# Patient Record
Sex: Female | Born: 1975 | Race: White | Hispanic: No | Marital: Married | Smoking: Never smoker
Health system: Southern US, Community
[De-identification: ages and names within clinical notes are randomized; demographics above are authoritative.]

## PROBLEM LIST (undated history)

## (undated) DIAGNOSIS — G540 Brachial plexus disorders: Secondary | ICD-10-CM

## (undated) DIAGNOSIS — D219 Benign neoplasm of connective and other soft tissue, unspecified: Secondary | ICD-10-CM

## (undated) DIAGNOSIS — H9190 Unspecified hearing loss, unspecified ear: Secondary | ICD-10-CM

## (undated) DIAGNOSIS — F988 Other specified behavioral and emotional disorders with onset usually occurring in childhood and adolescence: Secondary | ICD-10-CM

## (undated) DIAGNOSIS — A6000 Herpesviral infection of urogenital system, unspecified: Secondary | ICD-10-CM

## (undated) DIAGNOSIS — D649 Anemia, unspecified: Secondary | ICD-10-CM

## (undated) DIAGNOSIS — K5900 Constipation, unspecified: Secondary | ICD-10-CM

## (undated) DIAGNOSIS — N920 Excessive and frequent menstruation with regular cycle: Secondary | ICD-10-CM

## (undated) HISTORY — DX: Herpesviral infection of urogenital system, unspecified: A60.00

## (undated) HISTORY — DX: Excessive and frequent menstruation with regular cycle: N92.0

## (undated) HISTORY — PX: COLONOSCOPY: SHX174

## (undated) HISTORY — DX: Unspecified hearing loss, unspecified ear: H91.90

## (undated) HISTORY — PX: ABDOMINAL HYSTERECTOMY: SHX81

## (undated) HISTORY — DX: Anemia, unspecified: D64.9

## (undated) HISTORY — DX: Other specified behavioral and emotional disorders with onset usually occurring in childhood and adolescence: F98.8

## (undated) HISTORY — DX: Brachial plexus disorders: G54.0

## (undated) HISTORY — PX: DIAGNOSTIC LAPAROSCOPY WITH REMOVAL OF ECTOPIC PREGNANCY: SHX6449

## (undated) HISTORY — DX: Constipation, unspecified: K59.00

## (undated) HISTORY — DX: Benign neoplasm of connective and other soft tissue, unspecified: D21.9

---

## 1995-04-12 HISTORY — PX: AUGMENTATION MAMMAPLASTY: SUR837

## 2004-11-24 ENCOUNTER — Other Ambulatory Visit: Admission: RE | Admit: 2004-11-24 | Discharge: 2004-11-24 | Payer: Self-pay | Admitting: Obstetrics and Gynecology

## 2006-10-11 ENCOUNTER — Ambulatory Visit: Payer: Self-pay | Admitting: Internal Medicine

## 2006-11-13 ENCOUNTER — Ambulatory Visit: Payer: Self-pay | Admitting: Internal Medicine

## 2006-11-17 ENCOUNTER — Ambulatory Visit: Payer: Self-pay | Admitting: Internal Medicine

## 2006-11-17 LAB — CONVERTED CEMR LAB
AST: 20 units/L (ref 0–37)
Basophils Absolute: 0 10*3/uL (ref 0.0–0.1)
Bilirubin, Direct: 0.1 mg/dL (ref 0.0–0.3)
CO2: 29 meq/L (ref 19–32)
Chloride: 103 meq/L (ref 96–112)
Creatinine, Ser: 0.9 mg/dL (ref 0.4–1.2)
Direct LDL: 130.7 mg/dL
Eosinophils Relative: 0.9 % (ref 0.0–5.0)
Glucose, Bld: 98 mg/dL (ref 70–99)
HCT: 41.3 % (ref 36.0–46.0)
Hemoglobin: 14.6 g/dL (ref 12.0–15.0)
MCHC: 35.3 g/dL (ref 30.0–36.0)
Monocytes Absolute: 0.5 10*3/uL (ref 0.2–0.7)
Neutrophils Relative %: 73.1 % (ref 43.0–77.0)
Potassium: 4.3 meq/L (ref 3.5–5.1)
RDW: 11.7 % (ref 11.5–14.6)
Sodium: 140 meq/L (ref 135–145)
TSH: 2 microintl units/mL (ref 0.35–5.50)
Total Bilirubin: 0.7 mg/dL (ref 0.3–1.2)
Total CHOL/HDL Ratio: 2.9
Total Protein: 7.3 g/dL (ref 6.0–8.3)
WBC: 7.7 10*3/uL (ref 4.5–10.5)

## 2006-12-19 LAB — CONVERTED CEMR LAB: Pap Smear: NORMAL

## 2007-12-12 ENCOUNTER — Ambulatory Visit: Payer: Self-pay | Admitting: Internal Medicine

## 2007-12-12 DIAGNOSIS — F988 Other specified behavioral and emotional disorders with onset usually occurring in childhood and adolescence: Secondary | ICD-10-CM | POA: Insufficient documentation

## 2007-12-12 DIAGNOSIS — J309 Allergic rhinitis, unspecified: Secondary | ICD-10-CM | POA: Insufficient documentation

## 2007-12-26 ENCOUNTER — Ambulatory Visit: Payer: Self-pay | Admitting: *Deleted

## 2007-12-28 ENCOUNTER — Encounter: Payer: Self-pay | Admitting: Internal Medicine

## 2008-01-02 ENCOUNTER — Ambulatory Visit: Payer: Self-pay | Admitting: *Deleted

## 2008-01-09 ENCOUNTER — Ambulatory Visit: Payer: Self-pay | Admitting: *Deleted

## 2008-01-17 ENCOUNTER — Ambulatory Visit: Payer: Self-pay | Admitting: Internal Medicine

## 2008-01-24 ENCOUNTER — Telehealth: Payer: Self-pay | Admitting: Internal Medicine

## 2008-02-06 ENCOUNTER — Encounter: Payer: Self-pay | Admitting: Internal Medicine

## 2008-02-11 ENCOUNTER — Ambulatory Visit: Payer: Self-pay | Admitting: Internal Medicine

## 2008-03-04 ENCOUNTER — Ambulatory Visit: Payer: Self-pay | Admitting: Internal Medicine

## 2008-03-17 ENCOUNTER — Telehealth: Payer: Self-pay | Admitting: Internal Medicine

## 2008-04-15 ENCOUNTER — Telehealth (INDEPENDENT_AMBULATORY_CARE_PROVIDER_SITE_OTHER): Payer: Self-pay | Admitting: *Deleted

## 2008-04-16 ENCOUNTER — Ambulatory Visit: Payer: Self-pay | Admitting: Internal Medicine

## 2008-06-12 ENCOUNTER — Ambulatory Visit: Payer: Self-pay | Admitting: Internal Medicine

## 2008-08-18 ENCOUNTER — Telehealth: Payer: Self-pay | Admitting: Internal Medicine

## 2008-08-21 ENCOUNTER — Ambulatory Visit: Payer: Self-pay | Admitting: Internal Medicine

## 2008-11-13 ENCOUNTER — Ambulatory Visit: Payer: Self-pay | Admitting: Internal Medicine

## 2008-11-27 ENCOUNTER — Telehealth (INDEPENDENT_AMBULATORY_CARE_PROVIDER_SITE_OTHER): Payer: Self-pay | Admitting: *Deleted

## 2009-02-11 ENCOUNTER — Ambulatory Visit: Payer: Self-pay | Admitting: Internal Medicine

## 2009-05-13 ENCOUNTER — Ambulatory Visit: Payer: Self-pay | Admitting: Internal Medicine

## 2009-08-13 ENCOUNTER — Ambulatory Visit: Payer: Self-pay | Admitting: Internal Medicine

## 2009-10-27 ENCOUNTER — Telehealth (INDEPENDENT_AMBULATORY_CARE_PROVIDER_SITE_OTHER): Payer: Self-pay | Admitting: *Deleted

## 2010-05-11 NOTE — Assessment & Plan Note (Signed)
Summary: MEDICATION REFILL /HEA   Vital Signs:  Patient profile:   35 year old female Weight:      119.50 pounds BMI:     19.96 O2 Sat:      100 % on Room air Temp:     98.0 degrees F oral Pulse rate:   78 / minute Pulse rhythm:   regular Resp:     18 per minute BP sitting:   110 / 90  (right arm) Cuff size:   regular  Vitals Entered By: Glendell Docker CMA (May 13, 2009 9:21 AM)  O2 Flow:  Room air  Primary Care Provider:  D. Thomos Lemons DO  CC:  Medication Follow up .  History of Present Illness: Medication Follow up  35 year old white female with history of ADD for followup. no significant change since previous visit no significant weight change Denies sleep disturbance No behavioral issues  Still working in [employer] is picking up  hx of allergic rhinitis - wakes up with nasal congestion.  no sinus HA or pain  Allergies (verified): 1)  ! * Tiger Balm  Past History:  Past Medical History: ADD History of ectopic pregnancy x2 in 14 and in 1994. Chronic constipation status post negative colonoscopy in 1993.      Hearing loss     Family History: Colon CA - no Breast CA - no CAD - no Attention deficit disorder -sister      Social History: Occupation: Realtor/broker Married Never Smoked Alcohol use-yes            Physical Exam  General:  thin pleasant 35 y/o NAD Lungs:  normal respiratory effort and normal breath sounds.   Heart:  normal rate, regular rhythm, and no gallop.   Psych:  normally interactive, good eye contact, not anxious appearing, and not depressed appearing.     Impression & Recommendations:  Problem # 1:  ADD (ICD-314.00) Assessment Unchanged Stable.  effects of vyvanse not wearing off by afternoon.  pt tolerating vyvanse w/o side effects.  Maintain current medication regimen.  Problem # 2:  ALLERGIC RHINITIS (ICD-477.9) use nasal saline irrigation as directed.  Her updated medication list for this  problem includes:    Fluticasone Propionate 50 Mcg/act Susp (Fluticasone propionate) .Marland Kitchen... 2 sprays each nostril once daily  Complete Medication List: 1)  Loestrin Fe 1/20 1-20 Mg-mcg Tabs (Norethin ace-eth estrad-fe) .... Take 1 tablet by mouth once a day 2)  Vyvanse 70 Mg Caps (Lisdexamfetamine dimesylate) .... One by mouth once daily 3)  Vyvanse 70 Mg Caps (Lisdexamfetamine dimesylate) .... One by mouth once daily (fill on or after 06/10/2009) 4)  Vyvanse 70 Mg Caps (Lisdexamfetamine dimesylate) .... One by mouth once daily (fill on or after 07/11/2009) 5)  Fluticasone Propionate 50 Mcg/act Susp (Fluticasone propionate) .... 2 sprays each nostril once daily  Patient Instructions: 1)  Please schedule a follow-up appointment in 3 months. 2)  Use Lloyd Huger Med sinus rinse Prescriptions: VYVANSE 70 MG CAPS (LISDEXAMFETAMINE DIMESYLATE) one by mouth once daily (fill on or after 07/11/2009)  #30 x 0   Entered and Authorized by:   D. Thomos Lemons DO   Signed by:   D. Thomos Lemons DO on 05/13/2009   Method used:   Print then Give to Patient   RxID:   [id number] VYVANSE 70 MG CAPS (LISDEXAMFETAMINE DIMESYLATE) one by mouth once daily (fill on or after 06/10/2009)  #30 x 0   Entered and Authorized by:   D.  Thomos Lemons DO   Signed by:   D. Thomos Lemons DO on 05/13/2009   Method used:   Print then Give to Patient   RxID:   [id number] VYVANSE 70 MG CAPS (LISDEXAMFETAMINE DIMESYLATE) one by mouth once daily (fill on or after 06/10/2009)  #30 x 0   Entered and Authorized by:   D. Thomos Lemons DO   Signed by:   D. Thomos Lemons DO on 05/13/2009   Method used:   Print then Give to Patient   RxID:   [id number] VYVANSE 70 MG CAPS (LISDEXAMFETAMINE DIMESYLATE) one by mouth once daily  #30 x 0   Entered and Authorized by:   D. Thomos Lemons DO   Signed by:   D. Thomos Lemons DO on 05/13/2009   Method used:   Print then Give to Patient   RxID:   [id number] FLUTICASONE PROPIONATE 50 MCG/ACT SUSP (FLUTICASONE  PROPIONATE) 2 sprays each nostril once daily  #1 x 3   Entered and Authorized by:   D. Thomos Lemons DO   Signed by:   D. Thomos Lemons DO on 05/13/2009   Method used:   Electronically to        [id number] (retail)       [address]       [place], [place]  [address]       Ph: [phone] or [phone]       Fax: [phone]   RxID:   [id number]     Current Allergies (reviewed today): ! * TIGER BALM

## 2010-05-11 NOTE — Progress Notes (Signed)
  Phone Note Other Incoming   Request: Send information Summary of Call: Request for records received from  [facility]. Request forwarded to [facility].

## 2010-05-11 NOTE — Assessment & Plan Note (Signed)
Summary: weight check and refill meds/mhf   Vital Signs:  Patient profile:   35 year old female Height:      65 inches Weight:      116.50 pounds BMI:     19.46 O2 Sat:      100 % on Room air Temp:     99.0 degrees F oral Pulse rate:   77 / minute Pulse rhythm:   regular Resp:     16 per minute BP sitting:   130 / 80  (right arm) Cuff size:   regular  Vitals Entered By: Glendell Docker CMA (Aug 13, 2009 1:58 PM)  O2 Flow:  Room air CC: Rm 2- 3 Month follow up    Primary Care Provider:  Dondra Spry DO  CC:  Rm 2- 3 Month follow up .  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 * Tiger Balm  Past History:  Past Medical History: ADD History of ectopic pregnancy x 2 in 1993 and in 1994. Chronic constipation status post negative colonoscopy in 1993.      Hearing loss       Past Surgical History: Status post breast augmentation 1997. Bilateral hearing aids 2010         Family History: Colon CA - no Breast CA - no CAD - no Attention deficit disorder -sister       Social History: Occupation: Realtor/broker Married Never Smoked Alcohol use-yes         Physical Exam  General:  alert, well-developed, and well-nourished.   Lungs:  normal respiratory effort and normal breath sounds.   Heart:  normal rate, regular rhythm, and no gallop.     Impression & Recommendations:  Problem # 1:  ADD (ICD-314.00) Assessment Unchanged Pt with wt loss.  she attributes to regular exercise.  we discussed increasing intake of high calorie foods. if persistent wt loss, we discussed lowering vyvanse dose  Complete Medication List: 1)  Vyvanse 70 Mg Caps (Lisdexamfetamine dimesylate) .... One by mouth once daily 2)  Vyvanse 70 Mg Caps (Lisdexamfetamine  dimesylate) .... One by mouth once daily (fill on or after 09/13/2009) 3)  Vyvanse 70 Mg Caps (Lisdexamfetamine dimesylate) .... One by mouth once daily (fill on or after 10/13/2009) 4)  Fluticasone Propionate 50 Mcg/act Susp (Fluticasone propionate) .... 2 sprays each nostril once daily  Patient Instructions: 1)  Please schedule a follow-up appointment in 3 months. Prescriptions: VYVANSE 70 MG CAPS (LISDEXAMFETAMINE DIMESYLATE) one by mouth once daily (fill on or after 10/13/2009)  #30 x 0   Entered and Authorized by:   D. Thomos Lemons DO   Signed by:   D. Thomos Lemons DO on 08/13/2009   Method used:   Print then Give to Patient   RxID:   [id number] VYVANSE 70 MG CAPS (LISDEXAMFETAMINE DIMESYLATE) one by mouth once daily (fill on or after 09/13/2009)  #30 x 0   Entered and Authorized by:   D. Thomos Lemons DO   Signed by:   D. Thomos Lemons DO on 08/13/2009   Method used:   Print then Give to Patient   RxID:   [id number] VYVANSE 70 MG CAPS (LISDEXAMFETAMINE DIMESYLATE) one by mouth once daily  #30 x 0   Entered  and Authorized by:   D. Thomos Lemons DO   Signed by:   D. Thomos Lemons DO on 08/13/2009   Method used:   Print then Give to Patient   RxID:   [id number]   Current Allergies (reviewed today): ! * TIGER BALM

## 2010-08-24 NOTE — Assessment & Plan Note (Signed)
[facility]                           PRIMARY CARE OFFICE NOTE   Chelsea Weeks, Chelsea Weeks                      MRN:          [id number]  DATE:10/11/2006                            DOB:          08/09/1975    HISTORY AND PHYSICAL   CHIEF COMPLAINT:  A new patient to practice/ADHD.   HISTORY OF PRESENT ILLNESS:  The patient is a 35 year old white female  here to establish primary care.  She has been seeing her gynecologist  for most of her primary care needs.  She has been fairly healthy for  most of her life with the exception of ectopic pregnancy in 1993 and  1994.  They were able to perform surgery to preserve her fallopian tubes  and ovaries.  She has been working with her gynecologist regarding  issues with possible depression and possible ADD.  She reports symptoms  of ADD starting during her high school years wit difficulty  concentrating and completing assignments on time.  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  She was  prescribed Effexor XR 75 mg by her gynecologist.  She has been on  Effexor for approximately 1 year.  She has not noticed any significant  improvement in her ability to concentrate and focus.   PAST MEDICAL HISTORY/SUMMARY:  1. History of ectopic pregnancy x2 in 1993 and in 1994.  2. Status post breast augmentation 1997.  3. Chronic constipation status post negative colonoscopy in 1993.   CURRENT MEDICATIONS:  1. Loestrin once a day.  2. Effexor XR 75 mg once a day.   ALLERGIES TO MEDICATIONS:  None known.   SOCIAL HISTORY:  Patient is married, works as a Medical laboratory scientific officer, does not  have any children.  Does not plan on having any children.   HABITS:  Occasional alcohol, no tobacco and no recreational drugs.   FAMILY HISTORY:  Parents are healthy.  No significant medical issues.  Denies family history of premature heart disease or colon cancer.   REVIEW OF SYSTEMS:  No HEENT  symptoms.  Denies any chest pain,  palpitations, chronic cough or shortness of breath.  Denies dyspnea on  exertion.  No heartburn, nausea or vomiting.  Positive constipation with  bowel movements q. 1-2 weeks.  No dysuria, frequency, urgency and all  other systems negative.   PHYSICAL EXAMINATION:  VITAL SIGNS:  Height is 5 feet 4 inches. Weight  is 126.8 pounds.  Temperature is 98.8, pulse is 78.  BP is 120/70,  leftarm, in a seated position.  GENERAL:  Patient is a well-developed, well-nourished 35 year old white  female, no apparent distress.  HEENT:  Normocephalic, atraumatic.  Pupils were equal and reactive to  light bilaterally.  Extraocular motility was intact.  Patient was  anicteric.  Conjunctivae were within normal limits.  External auditory  canals and tympanic membranes were clear bilaterally.  There was some  mild gross hearing loss.  NECK:  Supple, no adenopathy, carotid bruit or thyromegaly.  Oropharyngeal exam looks unremarkable.  CHEST:  Normal expiratory effort. Chest is clear to auscultation  bilaterally and no rhonchi,  rales or wheezing.  CARDIOVASCULAR:  Regular rate and rhythm.  No significant murmurs, rubs  or gallops appreciated.  ABDOMEN:  Soft, nontender.  Positive bowel sounds.  No organomegaly.  MUSCULOSKELETAL:  No clubbing, cyanosis or edema.  SKIN:  Warm and dry.  NEUROLOGIC:  Cranial nerves II through XIIwere grossly intact.  She was  nonfocal.   IMPRESSIONS:  1. Attention deficit disorder.  2. History of chronic constipation.  3. History of ectopic pregnancy x2.  4. Health maintenance.   RECOMMENDATIONS:  1. Patient will taper off Effexor.  She has been given a prescription      for 37.5 mg to be taken once a day.  At the same time she will      start Wellbutrin XL 150 mg once a day to be upwardly titrated to      300 mg in 2 weeks.  2. We discussed the potential side effects.  3. We will check screening labs including thyroid function and  arrange      followup visit in 4 to 6 weeks.     Barbette Hair. Artist Pais, DO  Electronically Signed    RDY/MedQ  DD: 10/12/2006  DT: 10/12/2006  Job #: [id number]

## 2011-11-11 ENCOUNTER — Ambulatory Visit: Payer: Self-pay | Admitting: Internal Medicine

## 2012-04-11 HISTORY — PX: SALPINGECTOMY: SHX328

## 2012-07-11 ENCOUNTER — Ambulatory Visit: Payer: Self-pay

## 2012-09-06 ENCOUNTER — Ambulatory Visit: Payer: Self-pay | Admitting: Obstetrics and Gynecology

## 2012-09-06 LAB — BASIC METABOLIC PANEL
BUN: 19 mg/dL — ABNORMAL HIGH (ref 7–18)
EGFR (Non-African Amer.): >60
Osmolality: 279 (ref 275–301)
Potassium: 4 mmol/L (ref 3.5–5.1)

## 2012-09-06 LAB — CBC
HGB: 13.5 g/dL (ref 12.0–16.0)
MCH: 32.8 pg (ref 26.0–34.0)
MCHC: 35.2 g/dL (ref 32.0–36.0)
MCV: 93 fL (ref 80–100)
RBC: 4.13 10*6/uL (ref 3.80–5.20)
WBC: 5.9 10*3/uL (ref 3.6–11.0)

## 2012-09-06 LAB — PREGNANCY, URINE: Pregnancy Test, Urine: NEGATIVE m[IU]/mL

## 2012-09-14 ENCOUNTER — Ambulatory Visit: Payer: Self-pay | Admitting: Obstetrics and Gynecology

## 2012-09-14 HISTORY — PX: LAPAROSCOPIC SUPRACERVICAL HYSTERECTOMY: SUR797

## 2012-09-15 LAB — BASIC METABOLIC PANEL
Anion Gap: 4 — ABNORMAL LOW (ref 7–16)
BUN: 6 mg/dL — ABNORMAL LOW (ref 7–18)
Chloride: 107 mmol/L (ref 98–107)
Co2: 29 mmol/L (ref 21–32)
Creatinine: 0.65 mg/dL (ref 0.60–1.30)
EGFR (African American): >60
Glucose: 101 mg/dL — ABNORMAL HIGH (ref 65–99)
Osmolality: 277 (ref 275–301)
Potassium: 4 mmol/L (ref 3.5–5.1)

## 2012-09-15 LAB — HEMATOCRIT: HCT: 35.5 % (ref 35.0–47.0)

## 2012-09-18 LAB — PATHOLOGY REPORT

## 2014-08-01 NOTE — Op Note (Signed)
PATIENT NAME:  Chelsea Weeks, Chelsea Weeks MR#:  [id number] DATE OF BIRTH:  Oct 16, 1975  DATE OF PROCEDURE:  PREOPERATIVE DIAGNOSES:  Fibroid, dysmenorrhea, menorrhagia with anemia.   POSTOPERATIVE DIAGNOSES:  Fibroid, dysmenorrhea, menorrhagia with anemia.  PROCEDURE:  Laparoscopic supracervical hysterectomy, bilateral salpingectomy and lysis of severe bowel adhesions to the tube and left sidewall.   SURGEON:  Delsa Sale, MD  ASSISTANT:  Dr. Hiram Gash   ESTIMATED BLOOD LOSS:  100 mL.  FINDINGS:  Approximately 12-week size uterus, with multiple fibroids. Normal right tube and ovary. Left tube intertwined and adherent to the left bowel, which was adhered to the sidewall. Normal ureters bilaterally.   DESCRIPTION OF PROCEDURE:  The patient was taken to the Operating Room and placed in the supine position. After adequate general endotracheal anesthesia was instilled, the patient was prepped and draped in the usual sterile fashion. Timeout was performed. A side-opening speculum was placed in the patient's vagina. The anterior lip of the cervix was grasped with a single-tooth tenaculum, and the Hulka tenaculum was placed into the uterus. The side-opening speculum was removed. Catheter had been placed preoperatively. Attention was then turned to the umbilicus, which was injected with Marcaine. The umbilicus was incised. An infraumbilical incision was made. Veress needle was placed. Hang drop test, fluid instillation test, fluid aspiration test showed proper placement of the Veress needle. An 7 mm trocar was placed into the incision under direct visualization. The patient was placed in Trendelenburg. The aforementioned findings were seen. A 5 mm trocar port was placed in the right lower quadrant, an 11 mm trocar port was placed in the left lower quadrant. Photographs were taken. The Harmonic scalpel was placed into the belly, and the adhesions were sharply and bluntly dissected from the bowel and the tube. The  tube was then excised first on the left along the broad ligament. The round ligament was grasped and ligated. The uterine artery was skeletonized. The uterine artery was then grasped and ligated. A bladder flap was created. Attention was then turned to the right side, where the tube was excised from the broad ligament. The round ligament was grasped and ligated with the Harmonic scalpel. Attention was then turned to the uterine artery, which was skeletonized. A bladder flap was created. The bladder was pushed off the lower uterine segment. Harmonic scalpel was used to cut across the cervical uterine isthmus. The cervix was detached.the hukla tenaculum was removed. Trocar was removed from the left lower quadrant. Morcellator was placed. The uterus was morcellated under direct visualization in 2 pieces. The pelvis was copiously irrigated with warm normal saline. No fragments of tissue were identified. The bowel was then reinspected, given the additional amount of adhesions and manipulation of the bowel to remove it from the tube. No stretching or holes were identified. intercede  was then placed across the patient's cervix. The patient was taken out of Trendelenburg. the cone was then used to do a deep fascial stitch. Then 4-0 Monocryl was used to close the skin edges. Umbilical incision was approximated with a UR-6, 4-0 Monocryl on the skin edges, then with Dermabond. Clear urine was noted in the Foley bag. All the Marcaine had been used on the tubes prior to surgery. The patient was then taken to recovery after having tolerated the procedure well.     ____________________________ Delsa Sale, MD cck:mr D: 09/16/2012 14:21:24 ET T: 09/16/2012 22:23:27 ET JOB#: [id number]  cc: Delsa Sale, MD, <Dictator> Delsa Sale MD ELECTRONICALLY SIGNED  09/18/2012 11:58 

## 2016-04-11 DIAGNOSIS — G540 Brachial plexus disorders: Secondary | ICD-10-CM

## 2016-04-11 HISTORY — DX: Brachial plexus disorders: G54.0

## 2016-06-21 ENCOUNTER — Other Ambulatory Visit: Payer: Self-pay | Admitting: Family Medicine

## 2016-06-21 DIAGNOSIS — Z1231 Encounter for screening mammogram for malignant neoplasm of breast: Secondary | ICD-10-CM

## 2016-06-22 ENCOUNTER — Ambulatory Visit
Admission: RE | Admit: 2016-06-22 | Discharge: 2016-06-22 | Disposition: A | Discharge status: Home / Self Care | Payer: 59 | Source: Ambulatory Visit | Attending: Medical Oncology | Admitting: Medical Oncology

## 2016-06-22 ENCOUNTER — Other Ambulatory Visit: Payer: Self-pay | Admitting: Medical Oncology

## 2016-06-22 ENCOUNTER — Ambulatory Visit: Admission: RE | Admit: 2016-06-22 | Payer: 59 | Source: Ambulatory Visit

## 2016-06-22 DIAGNOSIS — R600 Localized edema: Secondary | ICD-10-CM

## 2016-06-22 DIAGNOSIS — R238 Other skin changes: Secondary | ICD-10-CM | POA: Insufficient documentation

## 2016-06-30 ENCOUNTER — Other Ambulatory Visit: Payer: Self-pay | Admitting: Family Medicine

## 2016-06-30 ENCOUNTER — Ambulatory Visit
Admission: RE | Admit: 2016-06-30 | Discharge: 2016-06-30 | Disposition: A | Discharge status: Home / Self Care | Payer: 59 | Source: Ambulatory Visit | Attending: Family Medicine | Admitting: Family Medicine

## 2016-06-30 DIAGNOSIS — Z1231 Encounter for screening mammogram for malignant neoplasm of breast: Secondary | ICD-10-CM | POA: Insufficient documentation

## 2016-07-18 ENCOUNTER — Ambulatory Visit (INDEPENDENT_AMBULATORY_CARE_PROVIDER_SITE_OTHER): Payer: 59 | Admitting: Vascular Surgery

## 2016-07-18 ENCOUNTER — Other Ambulatory Visit (INDEPENDENT_AMBULATORY_CARE_PROVIDER_SITE_OTHER): Payer: Self-pay | Admitting: Vascular Surgery

## 2016-07-18 ENCOUNTER — Encounter (INDEPENDENT_AMBULATORY_CARE_PROVIDER_SITE_OTHER): Payer: Self-pay | Admitting: Vascular Surgery

## 2016-07-18 ENCOUNTER — Encounter (INDEPENDENT_AMBULATORY_CARE_PROVIDER_SITE_OTHER): Payer: Self-pay

## 2016-07-18 VITALS — BP 159/103 | HR 74 | Resp 16 | Ht 64.0 in | Wt 118.0 lb

## 2016-07-18 DIAGNOSIS — I7300 Raynaud's syndrome without gangrene: Secondary | ICD-10-CM

## 2016-07-18 DIAGNOSIS — M79603 Pain in arm, unspecified: Secondary | ICD-10-CM | POA: Diagnosis not present

## 2016-07-18 DIAGNOSIS — F988 Other specified behavioral and emotional disorders with onset usually occurring in childhood and adolescence: Secondary | ICD-10-CM

## 2016-07-18 NOTE — Progress Notes (Signed)
MRN : [id number]  Chelsea Weeks is a 41 y.o. (Oct 09, 1975) female who presents with chief complaint of  Chief Complaint  Patient presents with  . New Evaluation    Arm Pain  .  History of Present Illness: The patient is seen for evaluation of painful right upper extremity.  Initially, the patient was seen by her primary care physician approximately one month ago for edema of the right arm which was associated with a discomfort that the patient described as a heaviness and tightness.  This was acute in onset occurring approximately 3 days prior to this initial evaluation. At the time she rated her discomfort as a 4 out of 10. This did not appear to be associated with any particular activities. She denied any alleviating or aggravating factors. She was questioned as to whether there were episodes of color changes or discoloration of the fingers in the past. She denied this. She is a nonsmoker there is no history of DVT in the past. No family history of bleeding clotting disorders. She is currently not taking hormone replacement.  The patient denies any trauma to the right upper extremity.  The patient denies rest pain or dangling of an extremity in a dependent position for relief. No open wounds or sores at this time. No history of DVT or phlebitis. No priorvascular interventions or surgeries.  There is a  history of back problems and DJD of the lumbar and sacral spine.    Current Meds  Medication Sig  . aspirin 81 MG chewable tablet Chew 81 mg by mouth daily.   Marland Kitchen lisdexamfetamine (VYVANSE) 70 MG capsule Take 70 mg by mouth daily.   . predniSONE (DELTASONE) 10 MG tablet Take 4 tabs on days 1-2, then 3 tabs on days 3-4, then 2 tabs on days 5-6 then 1 tab on day 7 then off  . vitamin B-12 (CYANOCOBALAMIN) 1000 MCG tablet Take 1,000 mcg by mouth daily.   . [DISCONTINUED] Ascorbic Acid (VITAMIN C) 1000 MG tablet Take by mouth.  . [DISCONTINUED] Cholecalciferol (VITAMIN D-1000 MAX ST)  1000 units tablet Take by mouth.  . [DISCONTINUED] cyclobenzaprine (FLEXERIL) 5 MG tablet Take by mouth.  . [DISCONTINUED] diclofenac (VOLTAREN) 75 MG EC tablet Take by mouth.  . [DISCONTINUED] fluticasone (FLONASE) 50 MCG/ACT nasal spray Place into the nose.  . [DISCONTINUED] MAGNESIUM PO Take by mouth.  . [DISCONTINUED] methocarbamol (ROBAXIN) 500 MG tablet   . [DISCONTINUED] valACYclovir (VALTREX) 500 MG tablet TAKE 4 TABLETS EVERY 12 HOURS FOR 2 DOSES, THEN TAKE 1 TABLET TWICE A DAY FOR 3-5 DAYS AS NEEDED    No past medical history on file.  Past Surgical History:  Procedure Laterality Date  . ABDOMINAL HYSTERECTOMY    . AUGMENTATION MAMMAPLASTY Bilateral 1997    Social History Social History  Substance Use Topics  . Smoking status: Never Smoker  . Smokeless tobacco: Never Used  . Alcohol use No    Family History Family History  Problem Relation Age of Onset  . Breast cancer Neg Hx   No family history of bleeding/clotting disorders, porphyria or autoimmune disease   Allergies  Allergen Reactions  . Fluzone [Flu Virus Vaccine] Anaphylaxis  . Prednisone     Blurred vision, facial numbness, hypertension, confusion   . Camphor-Menthol Rash    Tiger balm     REVIEW OF SYSTEMS (Negative unless checked)  Constitutional: [] Weight loss  [] Fever  [] Chills Cardiac: [] Chest pain   [] Chest pressure   [] Palpitations   [] Shortness  of breath when laying flat   [] Shortness of breath with exertion. Vascular:  [] Pain in legs with walking   [] Pain in legs at rest  [] History of DVT   [] Phlebitis   [] Swelling in legs   [] Varicose veins   [] Non-healing ulcers Pulmonary:   [] Uses home oxygen   [] Productive cough   [] Hemoptysis   [] Wheeze  [] COPD   [] Asthma Neurologic:  [] Dizziness   [] Seizures   [] History of stroke   [] History of TIA  [] Aphasia   [] Vissual changes   [] Weakness or numbness in arm   [] Weakness or numbness in leg Musculoskeletal:   [] Joint swelling   [] Joint pain   [] Low  back pain Hematologic:  [] Easy bruising  [] Easy bleeding   [] Hypercoagulable state   [] Anemic Gastrointestinal:  [] Diarrhea   [] Vomiting  [] Gastroesophageal reflux/heartburn   [] Difficulty swallowing. Genitourinary:  [] Chronic kidney disease   [] Difficult urination  [] Frequent urination   [] Blood in urine Skin:  [] Rashes   [] Ulcers  Psychological:  [] History of anxiety   []  History of major depression.  Physical Examination  Vitals:   07/18/16 0911  BP: (!) 159/103  Pulse: 74  Resp: 16  Weight: 53.5 kg (118 lb)  Height: 5\' 4"  (1.626 m)   Body mass index is 20.25 kg/m. Gen: WD/WN, NAD Head: [place]/AT, No temporalis wasting.  Ear/Nose/Throat: Hearing grossly intact, nares w/o erythema or drainage, poor dentition Eyes: PER, EOMI, sclera nonicteric.  Neck: Supple, no masses.  No bruit or JVD.  Pulmonary:  Good air movement, clear to auscultation bilaterally, no use of accessory muscles.  Cardiac: RRR, normal S1, S2, no Murmurs. Vascular: both hands demonstrate mild-to-moderate cyanotic changes with the right hand being more pronounced. The right arm is clearly larger than the left. It is nontender to palpation. The radial artery pulse does not diminish with military maneuvers. There are no palpable masses in the clavicular area. There are no bruits auscultated in the carotid or subclavian area. There is no point tenderness elicited Vessel Right Left  Radial Palpable Palpable  Ulnar Palpable Palpable  Brachial Palpable Palpable  Carotid Palpable Palpable  Gastrointestinal: soft, non-distended. No guarding/no peritoneal signs.  Musculoskeletal: M/S 5/5 throughout.  No deformity or atrophy.  Neurologic: CN 2-12 intact. Pain and light touch intact in extremities.  Symmetrical.  Speech is fluent. Motor exam as listed above. Psychiatric: Judgment intact, Mood & affect appropriate for pt's clinical situation. Dermatologic: No rashes or ulcers noted.  No changes consistent with cellulitis. Lymph  : No Cervical lymphadenopathy, no lichenification or skin changes of chronic lymphedema.  CBC Lab Results  Component Value Date   WBC 5.9 09/06/2012   HGB 13.5 09/06/2012   HCT 35.5 09/15/2012   MCV 93 09/06/2012   PLT 268 09/06/2012    BMET    Component Value Date/Time   NA 140 09/15/2012 0552   K 4.0 09/15/2012 0552   CL 107 09/15/2012 0552   CO2 29 09/15/2012 0552   GLUCOSE 101 (H) 09/15/2012 0552   BUN 6 (L) 09/15/2012 0552   CREATININE 0.65 09/15/2012 0552   CALCIUM 7.8 (L) 09/15/2012 0552   GFRNONAA >60 09/15/2012 0552   GFRAA >60 09/15/2012 0552   CrCl cannot be calculated (Patient's most recent lab result is older than the maximum 21 days allowed.).  COAG No results found for: INR, PROTIME  Radiology US Venous Img Upper Uni Right  Result Date: 06/22/2016 CLINICAL DATA:  Right arm edema and redness for 3 days EXAM: Right UPPER EXTREMITY VENOUS  DOPPLER ULTRASOUND TECHNIQUE: Gray-scale sonography with graded compression, as well as color Doppler and duplex ultrasound were performed to evaluate the upper extremity deep venous system from the level of the subclavian vein and including the jugular, axillary, basilic, radial, ulnar and upper cephalic vein. Spectral Doppler was utilized to evaluate flow at rest and with distal augmentation maneuvers. COMPARISON:  None. FINDINGS: Contralateral Subclavian Vein: Respiratory phasicity is normal and symmetric with the symptomatic side. No evidence of thrombus. Normal compressibility. Internal Jugular Vein: No evidence of thrombus. Normal compressibility, respiratory phasicity and response to augmentation. Subclavian Vein: No evidence of thrombus. Normal compressibility, respiratory phasicity and response to augmentation. Axillary Vein: No evidence of thrombus. Normal compressibility, respiratory phasicity and response to augmentation. Cephalic Vein: No evidence of thrombus. Normal compressibility, respiratory phasicity and response to  augmentation. Basilic Vein: No evidence of thrombus. Normal compressibility, respiratory phasicity and response to augmentation. Brachial Veins: No evidence of thrombus. Normal compressibility, respiratory phasicity and response to augmentation. Radial Veins: No evidence of thrombus. Normal compressibility, respiratory phasicity and response to augmentation. Ulnar Veins: No evidence of thrombus. Normal compressibility, respiratory phasicity and response to augmentation. IMPRESSION: No evidence of deep venous thrombosis. Electronically Signed   By: Donavan Foil M.D.   On: 06/22/2016 16:42   Mm Screening Breast W/implant Tomo Bilateral  Result Date: 06/30/2016 CLINICAL DATA:  Screening. EXAM: 2D DIGITAL SCREENING BILATERAL MAMMOGRAM WITH IMPLANTS, CAD AND ADJUNCT TOMO The patient has bilateral silicone implants. Standard and implant displaced views were performed. COMPARISON:  Previous exam(s). ACR Breast Density Category c: The breast tissue is heterogeneously dense, which may obscure small masses. FINDINGS: There are no findings suspicious for malignancy. Images were processed with CAD. IMPRESSION: No mammographic evidence of malignancy. A result letter of this screening mammogram will be mailed directly to the patient. RECOMMENDATION: Screening mammogram in one year. (Code:SM-B-01Y) BI-RADS CATEGORY  1:  Negative. Electronically Signed   By: Pamelia Hoit M.D.   On: 06/30/2016 13:14    Outside Studies/Documentation 10 pages of outside documents were reviewed.  They showed CT angiography of the left upper extremity dated 07/14/2016 demonstrates a widely patent arterial system without congenital abnormalities or pathologic changes then applied. There are no mass lesions in the apex of the thoracic cavity or the base of the neck. Mild edema of the forearm is confirmed.    X-ray of the C-spine dated 07/11/2016 is negative for a first rib there is diffuse degenerative changes identified.  CRP dated 07/14/2016  is negative also a sed rate same date is negative  Assessment/Plan 1. Pain of upper extremity, unspecified laterality  Recommend:  The patient has atypical pain symptoms of uncertain etiology. However, by history andon physical exam there is evidence of possible thoracic outlet syndrome or perhaps this could be complex pain syndrome.  The CT scan has excluded any mass lesions or tumors. There does not appear to be any hemodynamically significant arterial lesions. Her pulse by examination does not diminish with military maneuvers. And even though her duplex ultrasound was negative for DVT ultrasound is not able to adequately visualize the central venous system.  I do believe that there is a component of Raynaud's syndrome given the fact that her left hand also appeared to have mild to moderate cyanotic changes when I examined her. With a little bit of discussion it would appear that her trigger is more anxiety related and not necessarily cold related. However pager modification and the basic treatment plan for Raynaud's was reviewed with her.  Nevertheless I do not believe this explains everything since a pure Raynaud's process should not cause swelling of her arm as is clearly documented both on physical examination and by CT scan.  The patient should continue her usual exercise program.  I have recommended that we perform venogram to exclude a central venous stenosis and thoracic outlet syndrome with venous manifestations.The risks and benefits as well as the indications were reviewed all questions have been answered and she has agreed to proceed.  If the central venous system is normal and given the findings on CT angiography for her arterial system which appears to be normal I would then favor neurologic workup for complex pain syndrome.   A total of 70 minutes was spent with this patient and greater than 50% was spent in counseling and coordination of care with the patient.  Discussion included  the treatment options for vascular disease including indications for surgery and intervention.  Also discussed is the appropriate timing of treatment.  In addition medical therapy was discussed.  2. Raynaud's disease without gangrene See #1  3. Attention deficit disorder, unspecified hyperactivity presence Continue Vyvanse as prescribed    Hortencia Pilar, MD  07/18/2016 4:17 PM

## 2016-07-19 ENCOUNTER — Encounter
Admission: RE | Disposition: A | Discharge status: Home / Self Care | Payer: Self-pay | Source: Ambulatory Visit | Attending: Vascular Surgery

## 2016-07-19 ENCOUNTER — Encounter: Payer: Self-pay | Admitting: *Deleted

## 2016-07-19 ENCOUNTER — Ambulatory Visit
Admission: RE | Admit: 2016-07-19 | Discharge: 2016-07-19 | Disposition: A | Discharge status: Home / Self Care | Payer: Commercial Managed Care - HMO | Source: Ambulatory Visit | Attending: Vascular Surgery | Admitting: Vascular Surgery

## 2016-07-19 DIAGNOSIS — R2231 Localized swelling, mass and lump, right upper limb: Secondary | ICD-10-CM | POA: Diagnosis not present

## 2016-07-19 DIAGNOSIS — M79601 Pain in right arm: Secondary | ICD-10-CM | POA: Diagnosis not present

## 2016-07-19 DIAGNOSIS — Z90710 Acquired absence of both cervix and uterus: Secondary | ICD-10-CM | POA: Diagnosis not present

## 2016-07-19 DIAGNOSIS — I871 Compression of vein: Secondary | ICD-10-CM | POA: Diagnosis not present

## 2016-07-19 DIAGNOSIS — I7300 Raynaud's syndrome without gangrene: Secondary | ICD-10-CM | POA: Insufficient documentation

## 2016-07-19 DIAGNOSIS — F988 Other specified behavioral and emotional disorders with onset usually occurring in childhood and adolescence: Secondary | ICD-10-CM | POA: Insufficient documentation

## 2016-07-19 DIAGNOSIS — Z888 Allergy status to other drugs, medicaments and biological substances status: Secondary | ICD-10-CM | POA: Diagnosis not present

## 2016-07-19 DIAGNOSIS — Z887 Allergy status to serum and vaccine status: Secondary | ICD-10-CM | POA: Diagnosis not present

## 2016-07-19 HISTORY — PX: UPPER EXTREMITY VENOGRAPHY: CATH118272

## 2016-07-19 LAB — BUN: BUN: 12 mg/dL (ref 6–20)

## 2016-07-19 LAB — CREATININE, SERUM
Creatinine, Ser: 0.73 mg/dL (ref 0.44–1.00)
GFR calc Af Amer: >60 mL/min (ref >60)
GFR calc non Af Amer: >60 mL/min (ref >60)

## 2016-07-19 SURGERY — UPPER EXTREMITY VENOGRAPHY
Anesthesia: Moderate Sedation | Laterality: Right

## 2016-07-19 MED ORDER — FENTANYL CITRATE (PF) 100 MCG/2ML IJ SOLN
INTRAMUSCULAR | Status: DC | PRN
  Administered 2016-07-19 (×2): 50 ug via INTRAVENOUS

## 2016-07-19 MED ORDER — ONDANSETRON HCL 4 MG/2ML IJ SOLN: 4.0000 mg | Freq: Four times a day (QID) | INTRAMUSCULAR | Status: DC | PRN

## 2016-07-19 MED ORDER — MIDAZOLAM HCL 2 MG/2ML IJ SOLN
INTRAMUSCULAR | Status: DC | PRN
Start: 2016-07-19 — End: 2016-07-19
  Administered 2016-07-19: 2 mg via INTRAVENOUS
  Administered 2016-07-19: 1 mg via INTRAVENOUS

## 2016-07-19 MED ORDER — METHYLPREDNISOLONE SODIUM SUCC 125 MG IJ SOLR: 125.0000 mg | INTRAMUSCULAR | Status: DC | PRN

## 2016-07-19 MED ORDER — CEFAZOLIN IN D5W 1 GM/50ML IV SOLN
1.0000 g | Freq: Once | INTRAVENOUS | Status: AC
  Administered 2016-07-19: 1 g via INTRAVENOUS

## 2016-07-19 MED ORDER — LIDOCAINE HCL (PF) 1 % IJ SOLN
INTRAMUSCULAR | Status: AC
  Filled 2016-07-19: qty 30

## 2016-07-19 MED ORDER — HEPARIN SODIUM (PORCINE) 1000 UNIT/ML IJ SOLN
INTRAMUSCULAR | Status: AC
  Filled 2016-07-19: qty 1

## 2016-07-19 MED ORDER — SODIUM CHLORIDE 0.9 % IV SOLN: INTRAVENOUS | Status: DC

## 2016-07-19 MED ORDER — FENTANYL CITRATE (PF) 100 MCG/2ML IJ SOLN
INTRAMUSCULAR | Status: AC
  Filled 2016-07-19: qty 2

## 2016-07-19 MED ORDER — HYDROMORPHONE HCL 1 MG/ML IJ SOLN: 1.0000 mg | Freq: Once | INTRAMUSCULAR | Status: DC | PRN

## 2016-07-19 MED ORDER — HEPARIN (PORCINE) IN NACL 2-0.9 UNIT/ML-% IJ SOLN
INTRAMUSCULAR | Status: AC
  Filled 2016-07-19: qty 500

## 2016-07-19 MED ORDER — IOPAMIDOL (ISOVUE-300) INJECTION 61%
INTRAVENOUS | Status: DC | PRN
  Administered 2016-07-19: 10 mL via INTRA_ARTERIAL

## 2016-07-19 MED ORDER — FAMOTIDINE 20 MG PO TABS: 40.0000 mg | ORAL_TABLET | ORAL | Status: DC | PRN

## 2016-07-19 MED ORDER — MIDAZOLAM HCL 5 MG/5ML IJ SOLN
INTRAMUSCULAR | Status: AC
  Filled 2016-07-19: qty 5

## 2016-07-19 SURGICAL SUPPLY — 10 items
DEVICE TORQUE (MISCELLANEOUS) ×1 IMPLANT
DRAPE BRACHIAL (DRAPES) ×1 IMPLANT
GLIDECATH 4FR STR (CATHETERS) ×1 IMPLANT
GUIDEWIRE ANGLED .035 180CM (WIRE) ×1 IMPLANT
NDL ENTRY 21GA 7CM ECHOTIP (NEEDLE) IMPLANT
NEEDLE ENTRY 21GA 7CM ECHOTIP (NEEDLE) ×2 IMPLANT
PACK ANGIOGRAPHY (CUSTOM PROCEDURE TRAY) ×1 IMPLANT
SET INTRO CAPELLA COAXIAL (SET/KITS/TRAYS/PACK) ×1 IMPLANT
SHEATH BRITE TIP 4FRX11 (SHEATH) ×1 IMPLANT
TOWEL OR 17X26 4PK STRL BLUE (TOWEL DISPOSABLE) ×1 IMPLANT

## 2016-07-19 NOTE — Discharge Instructions (Signed)
Venogram, Care After °This sheet gives you information about how to care for yourself after your procedure. Your health care provider may also give you more specific instructions. If you have problems or questions, contact your health care provider. °What can I expect after the procedure? °After the procedure, it is common to have: °· Bruising or mild discomfort in the area where the IV was inserted (insertion site). ° °Follow these instructions at home: °Eating and drinking °· Follow instructions from your health care provider about eating or drinking restrictions. °· Drink a lot of fluids for the first several days after the procedure, as directed by your health care provider. This helps to wash (flush) the contrast out of your body. Examples of healthy fluids include water or low-calorie drinks. °General instructions °· Check your IV insertion area every day for signs of infection. Check for: °? Redness, swelling, or pain. °? Fluid or blood. °? Warmth. °? Pus or a bad smell. °· Take over-the-counter and prescription medicines only as told by your health care provider. °· Rest and return to your normal activities as told by your health care provider. Ask your health care provider what activities are safe for you. °· Do not drive for 24 hours if you were given a medicine to help you relax (sedative), or until your health care provider approves. °· Keep all follow-up visits as told by your health care provider. This is important. °Contact a health care provider if: °· Your skin becomes itchy or you develop a rash or hives. °· You have a fever that does not get better with medicine. °· You feel nauseous. °· You vomit. °· You have redness, swelling, or pain around the insertion site. °· You have fluid or blood coming from the insertion site. °· Your insertion area feels warm to the touch. °· You have pus or a bad smell coming from the insertion site. °Get help right away if: °· You have difficulty breathing or  shortness of breath. °· You develop chest pain. °· You faint. °· You feel very dizzy. °These symptoms may represent a serious problem that is an emergency. Do not wait to see if the symptoms will go away. Get medical help right away. Call your local emergency services (911 in the [place] Do not drive yourself to the hospital. °Summary °· After your procedure, it is common to have bruising or mild discomfort in the area where the IV was inserted. °· You should check your IV insertion area every day for signs of infection. °· Take over-the-counter and prescription medicines only as told by your health care provider. °· You should drink a lot of fluids for the first several days after the procedure to help flush the contrast from your body. °This information is not intended to replace advice given to you by your health care provider. Make sure you discuss any questions you have with your health care provider. °Document Released: 01/16/2013 Document Revised: 02/20/2016 Document Reviewed: 02/20/2016 °Elsevier Interactive Patient Education © 2017 [employer] ° °

## 2016-07-19 NOTE — Op Note (Signed)
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  Introduction catheter into superior vena cava  2.  Right arm venogram  3.  Central venography    Anesthesia: Conscious sedation was administered by the interventional radiology RN under my direct supervision. IV Versed plus fentanyl were utilized. Continuous ECG, pulse oximetry and blood pressure was monitored throughout the entire procedure. Conscious sedation was administered for a total of 25 minutes.  Sheath: 4 [place]  Contrast: 10 cc   Fluoroscopy Time: 0.4 minutes  Indications:  The patient presented to the office with several weeks of painful swelling of the right upper extremity. This was acute in onset. Duplex ultrasound of the arm is been done which was negative for DVT. CT angiography was performed which did not demonstrate any arterial abnormalities. Edema of the forearm musculature was noted on the CT scan. Given these findings I was suspicious for central venous stenosis consistent with thoracic outlet syndrome and therefore venography was arranged. The risks and benefits been reviewed with the patient all questions of been answered patient agreed to proceed.  Procedure:  Chelsea Weeks a 41 y.o. female who was identified and appropriate procedural time out was performed.  The patient was then placed supine on the table and prepped and draped in the usual sterile fashion.  Ultrasound was used to evaluate the right brachial vein at the level of the antecubital fossa.  It was echolucent and compressible indicating it is patent .  An ultrasound image was acquired for the  permanent record.  A micropuncture needle was used to access the right brachial vein under direct ultrasound guidance.  The microwire was then advanced under fluoroscopic guidance without difficulty followed by the micro-sheath was inserted.  Hand injection of contrast through the micro-sheath demonstrated the right upper extremity venous system. There is an abrupt cut off in the mid subclavian. Therefore, a Glidewire was introduced followed by a 4 [place] sheath and a 4 [place] straight glide catheter. Wire and catheter were then negotiated into the superior vena cava where hand injection contrast demonstrated patency of the right innominate vein and superior vena cava. The catheter was then repositioned and magnified imaging of the stricture was then obtained. After review these images the catheters removed and the sheath is pulled and pressures held.  Findings:   Right Upper Extremity:  The brachial veins are widely patent as is the axillary vein on the right side. In the mid subclavian vein there is a abrupt cut off. There are extensive collaterals that is now formed around the shoulder. Once the stricture is crossed imaging of the right innominate and the superior vena cava demonstrate there are widely patent without abnormality. I magnified imaging of the lesion itself there appears to be thrombus located within the stenosis.  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  Chelsea Weeks 07/19/2016,11:48 AM

## 2016-07-19 NOTE — H&P (Signed)
[facility] History & Physical Update  The patient was interviewed and re-examined.  The patient's previous History and Physical has been reviewed and is unchanged.  There is no change in the plan of care. We plan to proceed with the scheduled procedure.  Hortencia Pilar, MD  07/19/2016, 9:18 AM

## 2016-07-20 ENCOUNTER — Encounter: Payer: Self-pay | Admitting: Vascular Surgery

## 2016-08-08 ENCOUNTER — Other Ambulatory Visit: Payer: Self-pay | Admitting: Certified Nurse Midwife

## 2016-08-09 HISTORY — PX: RESECTION RIBS EXTRAPLEURAL: SUR1247

## 2016-11-07 ENCOUNTER — Other Ambulatory Visit (INDEPENDENT_AMBULATORY_CARE_PROVIDER_SITE_OTHER): Payer: Self-pay | Admitting: Vascular Surgery

## 2017-07-13 ENCOUNTER — Other Ambulatory Visit: Payer: Self-pay | Admitting: Family Medicine

## 2017-07-13 DIAGNOSIS — Z1231 Encounter for screening mammogram for malignant neoplasm of breast: Secondary | ICD-10-CM

## 2017-07-18 ENCOUNTER — Ambulatory Visit
Admission: RE | Admit: 2017-07-18 | Discharge: 2017-07-18 | Disposition: A | Discharge status: Home / Self Care | Payer: 59 | Source: Ambulatory Visit | Attending: Family Medicine | Admitting: Family Medicine

## 2017-07-18 DIAGNOSIS — Z1231 Encounter for screening mammogram for malignant neoplasm of breast: Secondary | ICD-10-CM | POA: Diagnosis present

## 2018-01-03 ENCOUNTER — Ambulatory Visit (INDEPENDENT_AMBULATORY_CARE_PROVIDER_SITE_OTHER): Payer: 59 | Admitting: Certified Nurse Midwife

## 2018-01-03 ENCOUNTER — Encounter: Payer: Self-pay | Admitting: Certified Nurse Midwife

## 2018-01-03 ENCOUNTER — Other Ambulatory Visit (HOSPITAL_COMMUNITY)
Admission: RE | Admit: 2018-01-03 | Discharge: 2018-01-03 | Disposition: A | Discharge status: Home / Self Care | Payer: 59 | Source: Ambulatory Visit | Attending: Certified Nurse Midwife | Admitting: Certified Nurse Midwife

## 2018-01-03 VITALS — BP 140/90 | HR 78 | Ht 64.0 in | Wt 116.2 lb

## 2018-01-03 DIAGNOSIS — Z124 Encounter for screening for malignant neoplasm of cervix: Secondary | ICD-10-CM

## 2018-01-03 DIAGNOSIS — Z01419 Encounter for gynecological examination (general) (routine) without abnormal findings: Secondary | ICD-10-CM

## 2018-01-03 NOTE — Progress Notes (Signed)
Gynecology Annual Exam  PCP: Hortencia Pilar, MD  Chief Complaint:  Chief Complaint  Patient presents with  . Gynecologic Exam    History of Present Illness:Chelsea Weeks is a 42 year old Caucasian/White female, [place], who presents for her annual exam. She is having no significant GYN problems.  Her menses are absent s/p LSH in 2014 for fibroids and menorrhagia.  She has occasional spotting.   The patient's past medical history is notable for a history of HSV II. She takes Valtrex episodically and may have an outbreak twice a year. She also has a history of ADD and hypertension. Since her last annual GYN exam dated 12/11/2015, she  had a resection of her right first rib for thoracic outlet syndrome She is sexually active. She is currently using a vasectomy and and hysterectomy for contraception.  Her most recent pap smear was obtained 12/11/2015 and was negative.  Her most recent mammogram obtained 07/18/2017 was negative. She does have breast implants.There is a questionable history of breast cancer in her mother and sister. They may have had DCIS or atypical cells removed with a lumpectomy. Sister did not need chemotherapy and is under frequent surveillance. Genetic testing has not been done.  There is no family history of ovarian cancer.  The patient does do monthly self breast exams.  The patient does not smoke.  The patient does drink a glass of wine a day.  The patient does not use illegal drugs.  The patient exercises regularly.  The patient does get adequate calcium in her diet.  She had a recent cholesterol screen in 2018 by PCP Dr Jacinto Reap Astrid Divine that was normal.     Review of Systems: Review of Systems  Constitutional: Negative for chills, fever and weight loss.  HENT: Negative for congestion, sinus pain and sore throat.   Eyes: Negative for blurred vision and pain.  Respiratory: Negative for hemoptysis, shortness of breath and wheezing.   Cardiovascular: Negative  for chest pain, palpitations and leg swelling.  Gastrointestinal: Negative for abdominal pain, blood in stool, diarrhea, heartburn, nausea and vomiting.  Genitourinary: Negative for dysuria, frequency, hematuria and urgency.  Musculoskeletal: Negative for back pain, joint pain and myalgias.  Skin: Negative for itching and rash.  Neurological: Negative for dizziness, tingling and headaches.  Endo/Heme/Allergies: Negative for environmental allergies and polydipsia. Does not bruise/bleed easily.       Negative for hirsutism   Psychiatric/Behavioral: Negative for depression. The patient is not nervous/anxious and does not have insomnia.     Past Medical History:  Past Medical History:  Diagnosis Date  . ADD (attention deficit disorder)   . Anemia    borderline  . Constipation   . Fibroid   . Hearing loss   . Herpes, genital   . Menorrhagia   . Thoracic outlet syndrome 2018   right    Past Surgical History:  Past Surgical History:  Procedure Laterality Date  . AUGMENTATION MAMMAPLASTY Bilateral 1997  . COLONOSCOPY    . DIAGNOSTIC LAPAROSCOPY WITH REMOVAL OF ECTOPIC PREGNANCY  1993; 1994   salpingostomies one each side  . LAPAROSCOPIC SUPRACERVICAL HYSTERECTOMY  09/14/2012   [facility]-fibroids and AUB  . RESECTION RIBS EXTRAPLEURAL Right 08/2016   thoracic outlet syndrome  . SALPINGECTOMY Bilateral 2014   HAS OVARIES  . UPPER EXTREMITY VENOGRAPHY Right 07/19/2016   Procedure: Upper Extremity Venography;  Surgeon: Katha Cabal, MD;  Location: [facility];  Service: Cardiovascular;  Laterality: Right;  Family History:  Family History  Problem Relation Age of Onset  . Breast cancer Mother 61  . Colon polyps Father   . Hypertension Father   . Cancer Father 19       PROSTATE - TX'D C RADIATION  . Breast cancer Sister 58       not aggressive - just needs 2 mammos/yr  . Cancer Maternal Aunt        LUNG  . COPD Maternal Grandmother   . Stroke Paternal Grandmother     . Diabetes Paternal Grandfather   . Stroke Paternal Grandfather     Social History:  Social History   Socioeconomic History  . Marital status: Married    Spouse name: KEVIN  . Number of children: 0  . Years of education: 27  . Highest education level: Not on file  Occupational History  . Occupation: REALTOR  Social Needs  . Financial resource strain: Not on file  . Food insecurity:    Worry: Not on file    Inability: Not on file  . Transportation needs:    Medical: Not on file    Non-medical: Not on file  Tobacco Use  . Smoking status: Never Smoker  . Smokeless tobacco: Never Used  Substance and Sexual Activity  . Alcohol use: Yes    Alcohol/week: 7.0 standard drinks    Types: 7 Glasses of wine per week    Comment: OCC  . Drug use: No  . Sexual activity: Yes    Birth control/protection: Surgical  Lifestyle  . Physical activity:    Days per week: Not on file    Minutes per session: Not on file  . Stress: Not on file  Relationships  . Social connections:    Talks on phone: Not on file    Gets together: Not on file    Attends religious service: Not on file    Active member of club or organization: Not on file    Attends meetings of clubs or organizations: Not on file    Relationship status: Not on file  . Intimate partner violence:    Fear of current or ex partner: Not on file    Emotionally abused: Not on file    Physically abused: Not on file    Forced sexual activity: Not on file  Other Topics Concern  . Not on file  Social History Narrative  . Not on file    Allergies:  Allergies  Allergen Reactions  . Fluzone [Flu Virus Vaccine] Anaphylaxis  . Lisinopril Cough  . Prednisone Other (See Comments)    Blurred vision, facial numbness, hypertension, confusion  Other reaction(s): Other (See Comments) Blurred vision, facial numbness, hypertension, confusion  Blurred vision, facial numbness, hypertension, confusion    . Camphor-Menthol Rash    Tiger  balm    Medications:  Current Outpatient Medications on File Prior to Visit  Medication Sig Dispense Refill  . acetaminophen (TYLENOL) 325 MG tablet Take 1 tablet by mouth as needed.    Marland Kitchen aspirin 81 MG chewable tablet Chew 81 mg by mouth daily.     . cyclobenzaprine (FLEXERIL) 5 MG tablet Take 1 tablet by mouth as needed.    Mariane Baumgarten Sodium 100 MG capsule Take 1 tablet by mouth as needed.    . fluticasone (FLONASE) 50 MCG/ACT nasal spray Place 2 sprays into the nose as needed.    Marland Kitchen lisdexamfetamine (VYVANSE) 70 MG capsule Take 70 mg by mouth daily.     Marland Kitchen  losartan (COZAAR) 25 MG tablet Take 1 tablet by mouth daily.    . Magnesium 250 MG TABS Take 1 tablet by mouth as needed.    . valACYclovir (VALTREX) 1000 MG tablet Take 1 tablet by mouth as needed.    . vitamin B-12 (CYANOCOBALAMIN) 1000 MCG tablet Take 1,000 mcg by mouth daily.      No current facility-administered medications on file prior to visit.    Physical Exam Vitals: BP 140/90   Pulse 78   Ht 5\' 4"  (1.626 m)   Wt 116 lb 4 oz (52.7 kg)   BMI 19.95 kg/m   General: pleasant, petite WF in NAD HEENT: normocephalic, anicteric Neck: no thyroid enlargement, no palpable nodules, no cervical lymphadenopathy  Pulmonary: No increased work of breathing, CTAB Cardiovascular: RRR, without murmur  Breast: Breast symmetrical, no tenderness, no palpable nodules or masses, no skin or nipple retraction present, no nipple discharge.  No axillary, infraclavicular or supraclavicular lymphadenopathy. Abdomen: Soft, non-tender, non-distended.  Umbilicus without lesions.  No hepatomegaly or masses palpable. No evidence of hernia. Genitourinary:  External: Normal external female genitalia.  Normal urethral meatus, normal Bartholin's and Skene's glands.    Vagina: Normal vaginal mucosa, no evidence of prolapse, small amt brown blood present   Cervix: Grossly normal in appearance, no bleeding, non-tender  Uterus: Surgically absent  Adnexa: No  adnexal masses, non-tender  Rectal: deferred  Lymphatic: no evidence of inguinal lymphadenopathy Extremities: no edema, erythema, or tenderness Neurologic: Grossly intact Psychiatric: mood appropriate, affect full     Assessment: 42 y.o. G3P0030 annual exam  Plan:   1) Breast cancer screening - recommend monthly self breast exam and annual screening mammogram Mammogram is up to date.  2) Cervical cancer screening - Pap was done. ASCCP guidelines and rational discussed.  Patient opts for every 2 year screening interval  3) Routine healthcare maintenance including cholesterol and diabetes screening managed by PCP . Discussed calcium and vitamin D3 requirements  4) RTO in 1-2 years for annual  Dalia Heading, [place]

## 2018-01-05 LAB — CYTOLOGY - PAP
DIAGNOSIS: NEGATIVE
Diagnosis: REACTIVE
HPV: NOT DETECTED

## 2018-01-06 ENCOUNTER — Encounter: Payer: Self-pay | Admitting: Certified Nurse Midwife

## 2018-01-06 DIAGNOSIS — A6000 Herpesviral infection of urogenital system, unspecified: Secondary | ICD-10-CM | POA: Insufficient documentation

## 2018-10-31 ENCOUNTER — Other Ambulatory Visit: Payer: Self-pay | Admitting: Family Medicine

## 2018-10-31 DIAGNOSIS — Z1231 Encounter for screening mammogram for malignant neoplasm of breast: Secondary | ICD-10-CM

## 2018-11-13 ENCOUNTER — Other Ambulatory Visit: Payer: Self-pay

## 2018-11-13 ENCOUNTER — Ambulatory Visit
Admission: RE | Admit: 2018-11-13 | Discharge: 2018-11-13 | Disposition: A | Discharge status: Home / Self Care | Payer: 59 | Source: Ambulatory Visit | Attending: Family Medicine | Admitting: Family Medicine

## 2018-11-13 DIAGNOSIS — Z1231 Encounter for screening mammogram for malignant neoplasm of breast: Secondary | ICD-10-CM | POA: Insufficient documentation

## 2020-01-16 ENCOUNTER — Other Ambulatory Visit: Payer: Self-pay | Admitting: Family Medicine

## 2020-01-16 DIAGNOSIS — Z1231 Encounter for screening mammogram for malignant neoplasm of breast: Secondary | ICD-10-CM

## 2020-01-29 ENCOUNTER — Other Ambulatory Visit: Payer: Self-pay | Admitting: Family Medicine

## 2020-01-29 ENCOUNTER — Ambulatory Visit
Admission: RE | Admit: 2020-01-29 | Discharge: 2020-01-29 | Disposition: A | Discharge status: Home / Self Care | Payer: 59 | Source: Ambulatory Visit | Attending: Family Medicine | Admitting: Family Medicine

## 2020-01-29 ENCOUNTER — Other Ambulatory Visit: Payer: Self-pay

## 2020-01-29 DIAGNOSIS — Z1231 Encounter for screening mammogram for malignant neoplasm of breast: Secondary | ICD-10-CM

## 2021-01-13 ENCOUNTER — Other Ambulatory Visit: Payer: Self-pay | Admitting: Family Medicine

## 2021-01-13 DIAGNOSIS — Z1231 Encounter for screening mammogram for malignant neoplasm of breast: Secondary | ICD-10-CM

## 2021-02-02 ENCOUNTER — Ambulatory Visit
Admission: RE | Admit: 2021-02-02 | Discharge: 2021-02-02 | Disposition: A | Discharge status: Home / Self Care | Payer: 59 | Source: Ambulatory Visit | Attending: Family Medicine | Admitting: Family Medicine

## 2021-02-02 ENCOUNTER — Other Ambulatory Visit: Payer: Self-pay

## 2021-02-02 DIAGNOSIS — Z1231 Encounter for screening mammogram for malignant neoplasm of breast: Secondary | ICD-10-CM | POA: Insufficient documentation

## 2022-02-09 ENCOUNTER — Other Ambulatory Visit: Payer: Self-pay | Admitting: Family Medicine

## 2022-02-09 DIAGNOSIS — Z1231 Encounter for screening mammogram for malignant neoplasm of breast: Secondary | ICD-10-CM

## 2022-02-10 ENCOUNTER — Ambulatory Visit
Admission: RE | Admit: 2022-02-10 | Discharge: 2022-02-10 | Disposition: A | Discharge status: Home / Self Care | Payer: BC Managed Care – PPO | Source: Ambulatory Visit | Attending: Family Medicine | Admitting: Family Medicine

## 2022-02-10 DIAGNOSIS — Z1231 Encounter for screening mammogram for malignant neoplasm of breast: Secondary | ICD-10-CM | POA: Diagnosis present

## 2023-02-11 IMAGING — MG DIGITAL SCREENING BREAST BILAT IMPLANT W/ TOMO W/ CAD
9 of 16 series · 9 of 32 positions shown · non-contrast
Comparison: Previous exam(s).

CLINICAL DATA: Screening.

EXAM:
DIGITAL SCREENING BILATERAL MAMMOGRAM WITH IMPLANTS, CAD AND
TOMOSYNTHESIS
TECHNIQUE: Bilateral screening digital craniocaudal and mediolateral oblique
mammograms were obtained. Bilateral screening digital breast
tomosynthesis was performed. The images were evaluated with
computer-aided detection. Standard and/or implant displaced views
were performed.

[L MLO (1 of 2)]
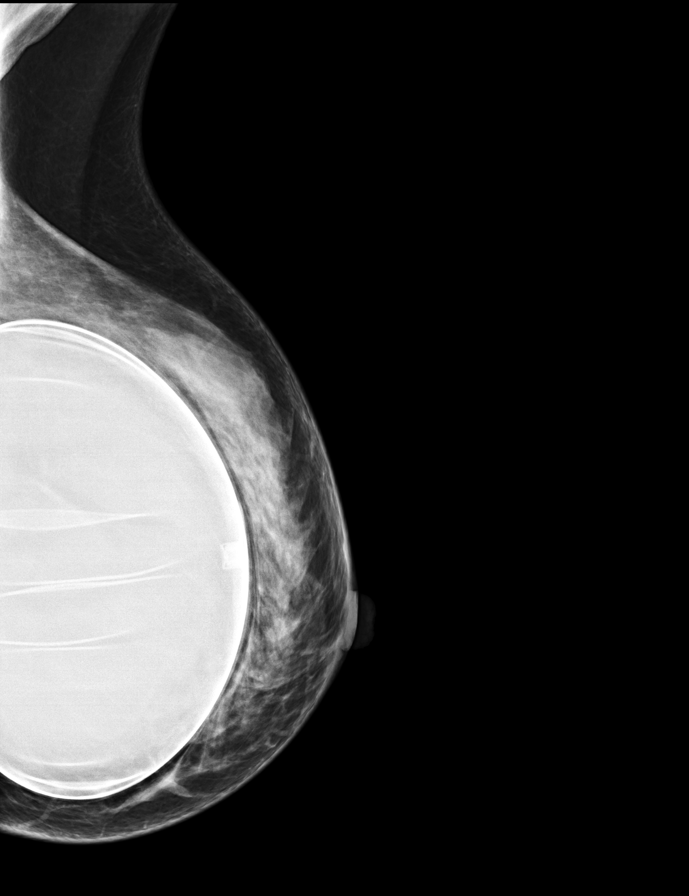

[L CC]
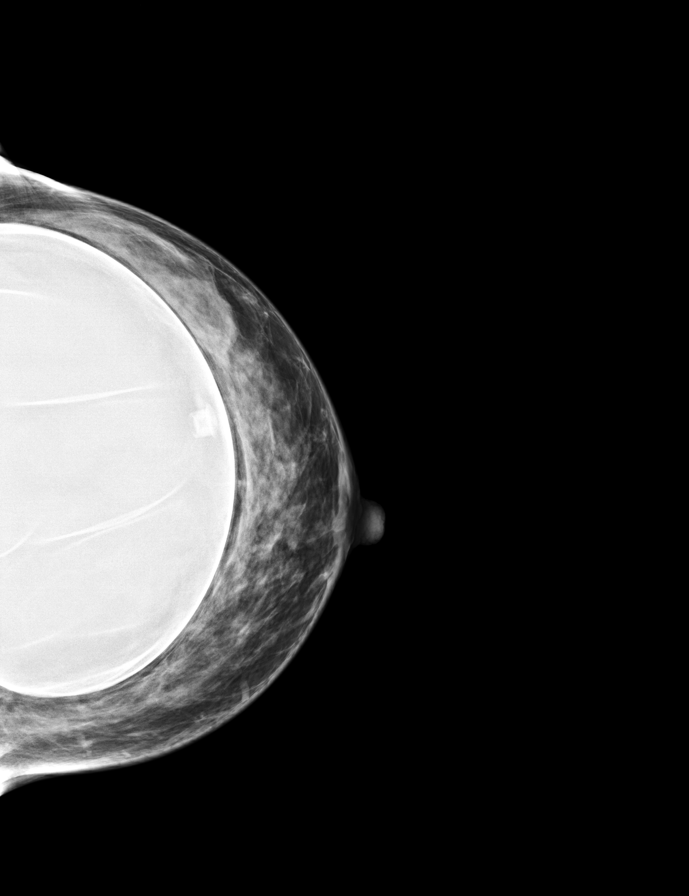

[R CC (1 of 2)]
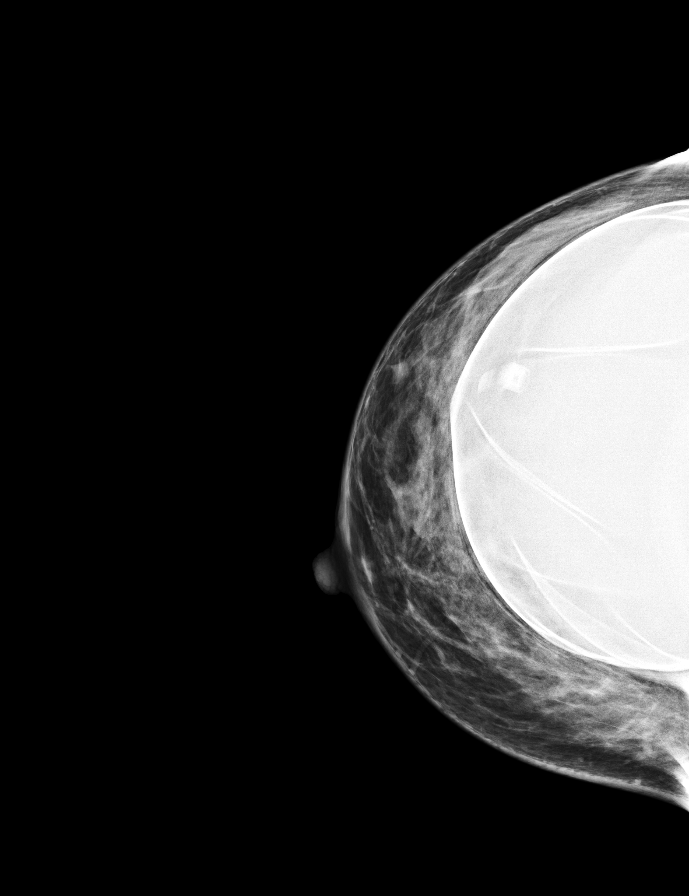

[R MLO]
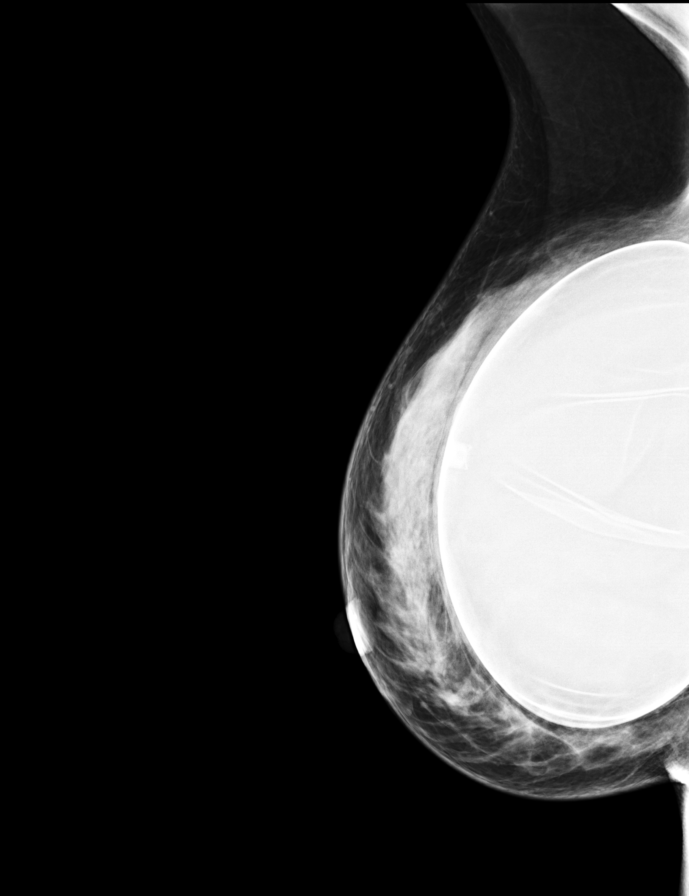

[R CC synth-2D]
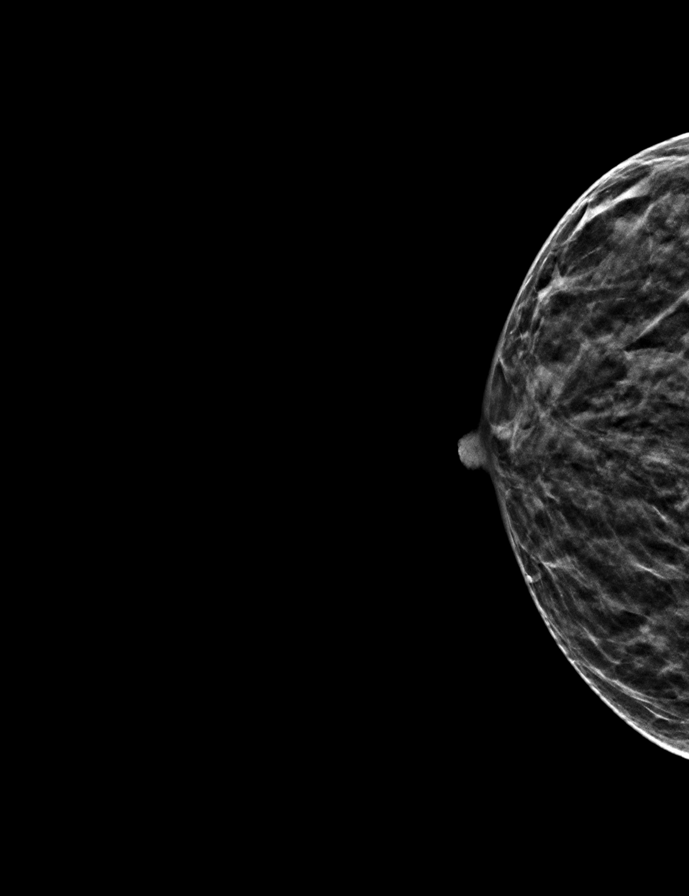

[R MLO synth-2D]
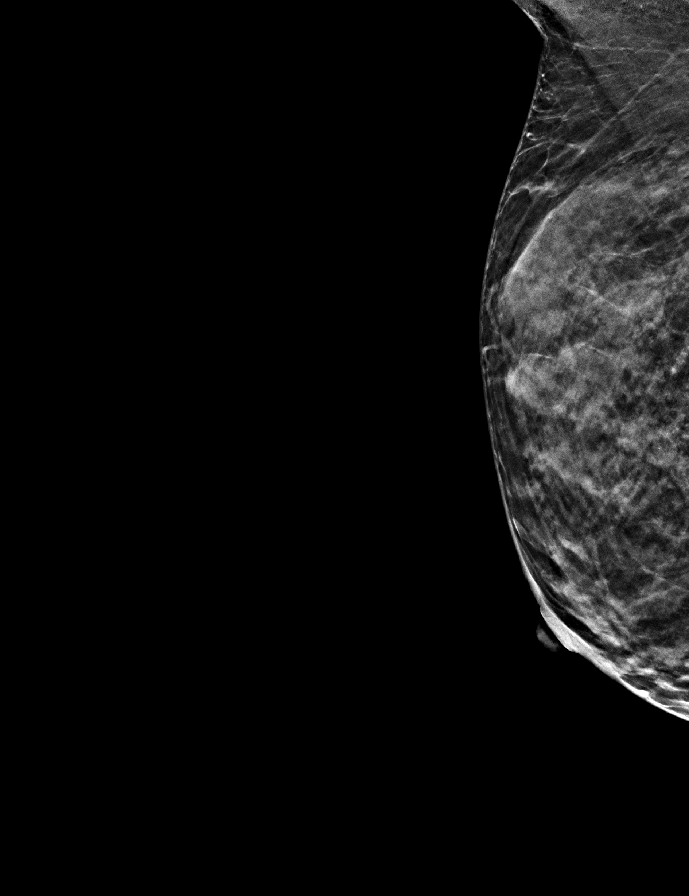

[L MLO (2 of 2)]
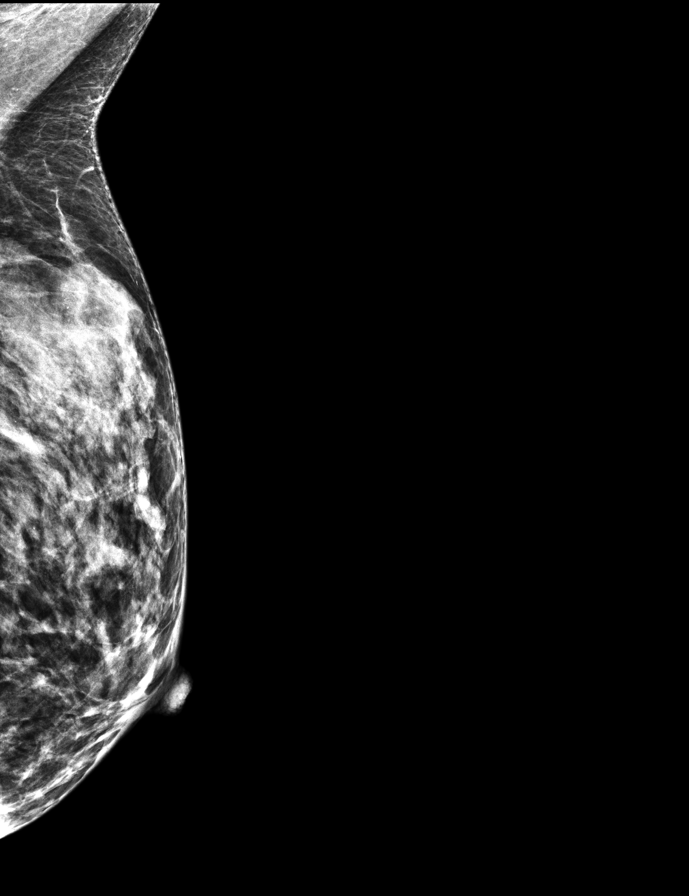

[R CC (2 of 2)]
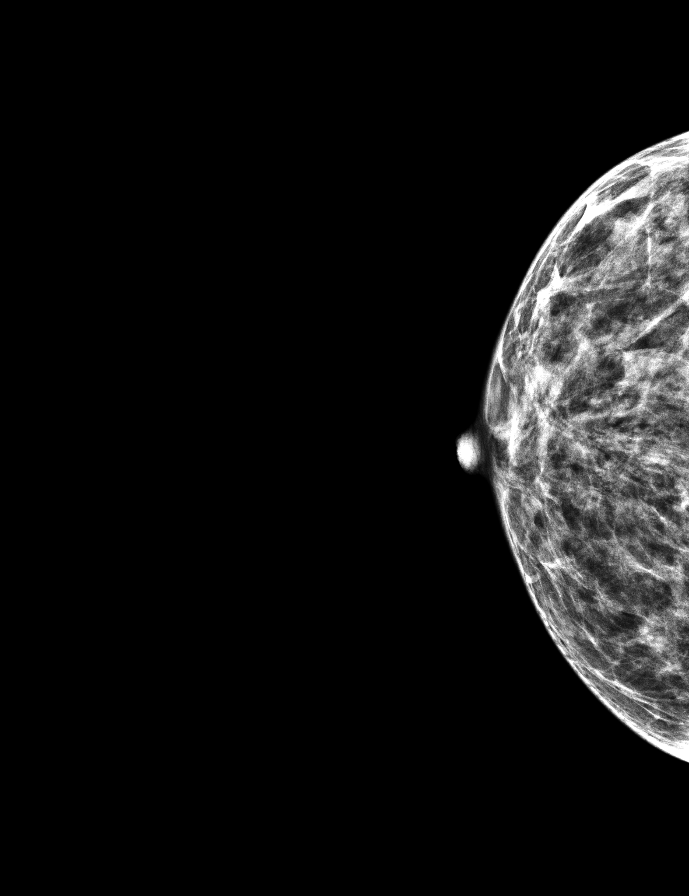

[L MLO synth-2D]
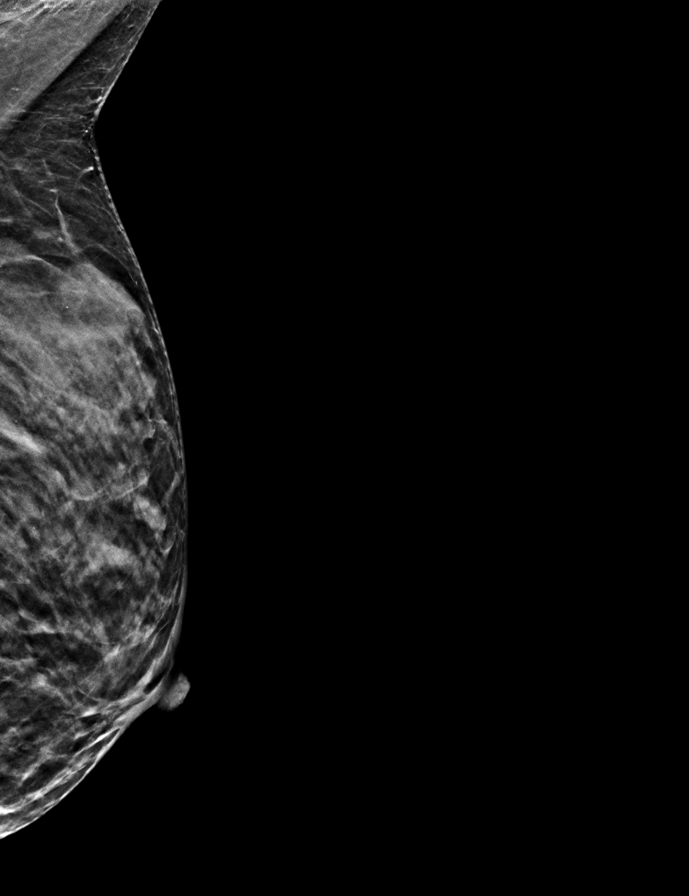

[9 of 32 positions shown; findings below may reference images not displayed]

ACR Breast Density Category c: The breast tissue is heterogeneously
dense, which may obscure small masses.
FINDINGS: The patient has retropectoral implants. There are no findings
suspicious for malignancy.
IMPRESSION: No mammographic evidence of malignancy. A result letter of this
screening mammogram will be mailed directly to the patient.

RECOMMENDATION:
Screening mammogram in one year. (Code:LT-E-7TH)

BI-RADS CATEGORY  1:  Negative.

## 2023-03-07 ENCOUNTER — Other Ambulatory Visit: Payer: Self-pay | Admitting: Family Medicine

## 2023-03-07 DIAGNOSIS — Z1231 Encounter for screening mammogram for malignant neoplasm of breast: Secondary | ICD-10-CM

## 2023-03-29 ENCOUNTER — Ambulatory Visit
Admission: RE | Admit: 2023-03-29 | Discharge: 2023-03-29 | Disposition: A | Discharge status: Home / Self Care | Payer: BC Managed Care – PPO | Source: Ambulatory Visit | Attending: Family Medicine | Admitting: Family Medicine

## 2023-03-29 DIAGNOSIS — Z1231 Encounter for screening mammogram for malignant neoplasm of breast: Secondary | ICD-10-CM | POA: Diagnosis present
